# Patient Record
Sex: Male | Born: 1975 | Race: White | Hispanic: No | Marital: Married | Smoking: Former smoker
Health system: Southern US, Community
[De-identification: ages and names within clinical notes are randomized; demographics above are authoritative.]

---

## 2020-08-23 ENCOUNTER — Other Ambulatory Visit: Payer: Self-pay

## 2020-08-23 ENCOUNTER — Encounter (HOSPITAL_COMMUNITY): Payer: Self-pay

## 2020-08-23 ENCOUNTER — Emergency Department (HOSPITAL_COMMUNITY)
Admission: EM | Admit: 2020-08-23 | Discharge: 2020-08-23 | Disposition: A | Discharge status: Home / Self Care | Payer: Self-pay | Attending: Emergency Medicine | Admitting: Emergency Medicine

## 2020-08-23 ENCOUNTER — Emergency Department (HOSPITAL_COMMUNITY): Payer: Self-pay

## 2020-08-23 DIAGNOSIS — M25532 Pain in left wrist: Secondary | ICD-10-CM | POA: Insufficient documentation

## 2020-08-23 DIAGNOSIS — Z5321 Procedure and treatment not carried out due to patient leaving prior to being seen by health care provider: Secondary | ICD-10-CM | POA: Insufficient documentation

## 2020-08-23 DIAGNOSIS — W19XXXA Unspecified fall, initial encounter: Secondary | ICD-10-CM | POA: Insufficient documentation

## 2020-08-23 NOTE — ED Triage Notes (Signed)
Pt presents with Left wrist pain d/t fall 3 days ago

## 2020-08-23 NOTE — ED Notes (Signed)
Called pt x2 for room, no response. 

## 2020-08-24 NOTE — ED Provider Notes (Signed)
Patient left without being seen. I didn't establish care with patient    Charlynne Pander, MD 08/24/20 1455

## 2020-11-23 ENCOUNTER — Encounter (HOSPITAL_COMMUNITY): Payer: Self-pay

## 2020-11-23 ENCOUNTER — Emergency Department (HOSPITAL_COMMUNITY)
Admission: EM | Admit: 2020-11-23 | Discharge: 2020-11-23 | Disposition: A | Discharge status: Home / Self Care | Payer: Self-pay | Attending: Emergency Medicine | Admitting: Emergency Medicine

## 2020-11-23 ENCOUNTER — Emergency Department (HOSPITAL_COMMUNITY): Payer: Self-pay

## 2020-11-23 DIAGNOSIS — S7002XA Contusion of left hip, initial encounter: Secondary | ICD-10-CM | POA: Insufficient documentation

## 2020-11-23 DIAGNOSIS — W1839XA Other fall on same level, initial encounter: Secondary | ICD-10-CM | POA: Diagnosis not present

## 2020-11-23 DIAGNOSIS — Y990 Civilian activity done for income or pay: Secondary | ICD-10-CM | POA: Diagnosis not present

## 2020-11-23 DIAGNOSIS — W19XXXA Unspecified fall, initial encounter: Secondary | ICD-10-CM

## 2020-11-23 DIAGNOSIS — Z87891 Personal history of nicotine dependence: Secondary | ICD-10-CM | POA: Diagnosis not present

## 2020-11-23 DIAGNOSIS — Y9301 Activity, walking, marching and hiking: Secondary | ICD-10-CM | POA: Diagnosis not present

## 2020-11-23 DIAGNOSIS — S79912A Unspecified injury of left hip, initial encounter: Secondary | ICD-10-CM | POA: Diagnosis present

## 2020-11-23 MED ORDER — KETOROLAC TROMETHAMINE 15 MG/ML IJ SOLN
15.0000 mg | Freq: Once | INTRAMUSCULAR | Status: AC
  Administered 2020-11-23: 15 mg via INTRAMUSCULAR
  Filled 2020-11-23: qty 1

## 2020-11-23 NOTE — ED Provider Notes (Signed)
[facility]-EMERGENCY DEPT Provider Note   CSN: [id number] Arrival date & time: 11/23/20  1316     History Chief Complaint  Patient presents with  . Fall    Craig Clayton is a 45 y.o. male.  45 year old male with prior medical history as detailed below presents for evaluation.  Patient reports that he fell on Friday while at work.  He slipped on a wet floor and fell backwards and landed hard on his left posterior hip.  Patient reports he has been walking since the fall.  He is taken ibuprofen at home with some degree of relief of his symptoms.  He denies head injury.  He denies neck pain.  He denies other significant injury.  He requests x-ray primarily for purposes of his Worker's Comp. claim.  The history is provided by the patient and medical records.  Fall This is a new problem. The current episode started 2 days ago. The problem occurs rarely. The problem has not changed since onset.Pertinent negatives include no chest pain and no abdominal pain. Nothing aggravates the symptoms. Nothing relieves the symptoms.       History reviewed. No pertinent past medical history.  There are no problems to display for this patient.   History reviewed. No pertinent surgical history.     History reviewed. No pertinent family history.  Social History   Tobacco Use  . Smoking status: Former Games developer  . Smokeless tobacco: Never Used    Home Medications Prior to Admission medications   Not on File    Allergies    Tylenol with codeine #3 [acetaminophen-codeine]  Review of Systems   Review of Systems  Cardiovascular: Negative for chest pain.  Gastrointestinal: Negative for abdominal pain.  All other systems reviewed and are negative.   Physical Exam Updated Vital Signs BP (!) 156/89 (BP Location: Right Arm)   Pulse 78   Temp 98.6 F (37 C) (Oral)   Resp 18   SpO2 98%   Physical Exam Vitals and nursing note reviewed.  Constitutional:      General:  He is not in acute distress.    Appearance: Normal appearance. He is well-developed.  HENT:     Head: Normocephalic and atraumatic.  Eyes:     Conjunctiva/sclera: Conjunctivae normal.     Pupils: Pupils are equal, round, and reactive to light.  Cardiovascular:     Rate and Rhythm: Normal rate and regular rhythm.     Heart sounds: Normal heart sounds.  Pulmonary:     Effort: Pulmonary effort is normal. No respiratory distress.     Breath sounds: Normal breath sounds.  Abdominal:     General: There is no distension.     Palpations: Abdomen is soft.     Tenderness: There is no abdominal tenderness.  Musculoskeletal:        General: Tenderness present. No deformity. Normal range of motion.     Cervical back: Normal range of motion and neck supple.     Comments: Mild diffuse tenderness over the posterior left lateral hip  Normal gait  Distal LLE is NVI   Skin:    General: Skin is warm and dry.  Neurological:     Mental Status: He is alert and oriented to person, place, and time.     ED Results / Procedures / Treatments   Labs (all labs ordered are listed, but only abnormal results are displayed) Labs Reviewed - No data to display  EKG None  Radiology DG Hip  Unilat W or Wo Pelvis 2-3 Views Left  Result Date: 11/23/2020 CLINICAL DATA:  Fall 2 days ago.  Left hip pain.  Initial encounter. EXAM: DG HIP (WITH OR WITHOUT PELVIS) 2-3V LEFT COMPARISON:  None. FINDINGS: There is no evidence of hip fracture or dislocation. There is no evidence of arthropathy or other focal bone abnormality. IMPRESSION: Negative. Electronically Signed   By: Danae Orleans M.D.   On: 11/23/2020 14:01    Procedures Procedures   Medications Ordered in ED Medications  ketorolac (TORADOL) 15 MG/ML injection 15 mg (15 mg Intramuscular Given 11/23/20 1340)    ED Course  I have reviewed the triage vital signs and the nursing notes.  Pertinent labs & imaging results that were available during my care of  the patient were reviewed by me and considered in my medical decision making (see chart for details).    MDM Rules/Calculators/A&P                          MDM  MSE complete  Craig Clayton was evaluated in Emergency Department on 11/23/2020 for the symptoms described in the history of present illness. He was evaluated in the context of the global COVID-19 pandemic, which necessitated consideration that the patient might be at risk for infection with the SARS-CoV-2 virus that causes COVID-19. Institutional protocols and algorithms that pertain to the evaluation of patients at risk for COVID-19 are in a state of rapid change based on information released by regulatory bodies including the CDC and federal and state organizations. These policies and algorithms were followed during the patient's care in the ED.  Patient is presenting for evaluation after reported fall.  Exam and imaging are without evidence of significant traumatic injury.  Importance of close follow-up was stressed.  Strict return precautions given and understood.     Final Clinical Impression(s) / ED Diagnoses Final diagnoses:  Fall, initial encounter  Contusion of left hip, initial encounter    Rx / DC Orders ED Discharge Orders    None       Wynetta Fines, MD 11/23/20 1413

## 2020-11-23 NOTE — Discharge Instructions (Addendum)
Please return for any problem.  °

## 2020-11-23 NOTE — ED Triage Notes (Signed)
Pt presents with c/o fall that occurred on Friday afternoon at work. Pt ambulatory to triage. Pt reported that his back was really sore as well as his left hip when he woke up yesterday.

## 2021-02-04 ENCOUNTER — Encounter (HOSPITAL_COMMUNITY): Payer: Self-pay | Admitting: Emergency Medicine

## 2021-02-04 ENCOUNTER — Emergency Department (HOSPITAL_COMMUNITY): Payer: Self-pay

## 2021-02-04 ENCOUNTER — Emergency Department (HOSPITAL_COMMUNITY)
Admission: EM | Admit: 2021-02-04 | Discharge: 2021-02-05 | Disposition: A | Discharge status: Home / Self Care | Payer: Self-pay | Attending: Emergency Medicine | Admitting: Emergency Medicine

## 2021-02-04 DIAGNOSIS — Z87891 Personal history of nicotine dependence: Secondary | ICD-10-CM | POA: Insufficient documentation

## 2021-02-04 DIAGNOSIS — S0990XA Unspecified injury of head, initial encounter: Secondary | ICD-10-CM | POA: Diagnosis not present

## 2021-02-04 DIAGNOSIS — M25532 Pain in left wrist: Secondary | ICD-10-CM | POA: Insufficient documentation

## 2021-02-04 DIAGNOSIS — M5432 Sciatica, left side: Secondary | ICD-10-CM | POA: Diagnosis not present

## 2021-02-04 DIAGNOSIS — W1830XA Fall on same level, unspecified, initial encounter: Secondary | ICD-10-CM | POA: Diagnosis not present

## 2021-02-04 NOTE — ED Provider Notes (Signed)
Emergency Medicine Provider Triage Evaluation Note  Craig Clayton , a 45 y.o. male  was evaluated in triage.  Pt complains of generalized body aches, left wrist pain, and left hip pain.  Pain constant since April of this year.  Patient has had no recent falls or injuries.  Patient denies any numbness, weakness, saddle anesthesia, bowel or bladder dysfunction, fevers, chills.  Patient denies any IV drug use  Review of Systems  Positive: Generalized myalgia, left wrist pain with hip pain Negative: umbness, weakness, saddle anesthesia, bowel or bladder dysfunction, fevers, chills  Physical Exam  BP (!) 148/93   Pulse 68   Temp 98 F (36.7 C)   Resp 18   SpO2 100%  Gen:   Awake, no distress   Resp:  Normal effort  MSK:   Moves extremities without difficulty.  Full range of motion to left wrist, diffuse tenderness to left wrist.  Tenderness to left hip, no deformity or crepitus noted.  Pelvis stable. Other:    Medical Decision Making  Medically screening exam initiated at 1:12 PM.  Appropriate orders placed.  Craig Clayton was informed that the remainder of the evaluation will be completed by another provider, this initial triage assessment does not replace that evaluation, and the importance of remaining in the ED until their evaluation is complete.  The patient appears stable so that the remainder of the work up may be completed by another provider.      Haskel Schroeder, PA-C 02/04/21 1314    Arby Barrette, MD 02/05/21 [phone]

## 2021-02-04 NOTE — ED Triage Notes (Signed)
Pt here with c/o general aches and pains from a fall in [employer] from April of this year

## 2021-02-05 ENCOUNTER — Emergency Department (HOSPITAL_COMMUNITY): Payer: Self-pay

## 2021-02-05 MED ORDER — METHOCARBAMOL 500 MG PO TABS: 500.0000 mg | ORAL_TABLET | Freq: Three times a day (TID) | ORAL | 0 refills | Status: AC | PRN

## 2021-02-05 MED ORDER — METHYLPREDNISOLONE 4 MG PO TBPK: ORAL_TABLET | ORAL | 0 refills | Status: AC

## 2021-02-05 NOTE — Discharge Instructions (Addendum)
Take the steroids and muscle relaxers as prescribed.  Follow-up with your primary doctor as well as neurologist.  Return to the ED with weakness, numbness, tingling, difficulty controlling your bowels or bladder or any other concerns.

## 2021-02-05 NOTE — ED Provider Notes (Signed)
[facility] EMERGENCY DEPARTMENT Provider Note   CSN: [id number] Arrival date & time: 02/04/21  1219     History No chief complaint on file.   Craig Clayton is a 45 y.o. male.   Patient complains of left hip and left wrist pain ongoing for several years ever since a fall in 2013 but worsening over the past several weeks.  States he fell again 2 months ago on a wet floor while washing dishes at [facility] and symptoms of gotten worse since then.  Last fall was 2 months ago.  Has increased pain to his wrist and left hip that radiates down his left leg.  Denies any weakness, numbness, tingling, bowel bladder incontinence, fever or vomiting.  No history of IV drug abuse or cancer.  No history of back surgery.  Did have surgery on his wrist in 2013 at [facility].  Intermittently has pain in this area but became worse over the past several weeks.  Taking ibuprofen without relief.  Also having left hip pain which he has had since his fall in 2013 that radiates down his left leg.  No chest pain or shortness of breath.  No abdominal pain.  Also concerned about headaches that he has been having since his fall in April.  States he hit his head and may be lost consciousness.  Having intermittent episodes of confusion and headaches since then.  No visual changes.  No focal weakness, numbness or tingling.   The history is provided by the patient.      History reviewed. No pertinent past medical history.  There are no problems to display for this patient.   History reviewed. No pertinent surgical history.     No family history on file.  Social History   Tobacco Use   Smoking status: Former    Pack years: 0.00   Smokeless tobacco: Never    Home Medications Prior to Admission medications   Not on File    Allergies    Tylenol with codeine #3 [acetaminophen-codeine]  Review of Systems   Review of Systems  Constitutional:  Negative for activity change, appetite change and fever.   HENT:  Negative for congestion.   Eyes:  Negative for visual disturbance.  Respiratory:  Negative for cough, chest tightness and shortness of breath.   Gastrointestinal:  Negative for abdominal pain, nausea and vomiting.  Genitourinary:  Negative for dysuria and hematuria.  Musculoskeletal:  Positive for arthralgias and myalgias.  Skin:  Negative for wound.  Neurological:  Positive for headaches. Negative for weakness and light-headedness.   all other systems are negative except as noted in the HPI and PMH.   Physical Exam Updated Vital Signs BP 134/78   Pulse 75   Temp 98 F (36.7 C)   Resp (!) 22   SpO2 100%   Physical Exam Vitals and nursing note reviewed.  Constitutional:      General: He is not in acute distress.    Appearance: He is well-developed.     Comments: Smells of marijuana  HENT:     Head: Normocephalic and atraumatic.     Mouth/Throat:     Pharynx: No oropharyngeal exudate.  Eyes:     Conjunctiva/sclera: Conjunctivae normal.     Pupils: Pupils are equal, round, and reactive to light.  Neck:     Comments: No meningismus. Cardiovascular:     Rate and Rhythm: Normal rate and regular rhythm.     Heart sounds: Normal heart sounds. No murmur heard.  Pulmonary:     Effort: Pulmonary effort is normal. No respiratory distress.     Breath sounds: Normal breath sounds.  Abdominal:     Palpations: Abdomen is soft.     Tenderness: There is no abdominal tenderness. There is no guarding or rebound.  Musculoskeletal:        General: No tenderness. Normal range of motion.     Cervical back: Normal range of motion and neck supple.     Comments: Left wrist with well-healed surgical incision.  Flexion extension are intact, cardinal hand movements are intact.  Diffusely tender.  Not warm or erythematous  Pain with range of motion of left hip. 5/5 strength in bilateral lower extremities. Ankle plantar and dorsiflexion intact. Great toe extension intact bilaterally. +2 DP  and PT pulses. +2 patellar reflexes bilaterally. Normal gait.   Skin:    General: Skin is warm.  Neurological:     Mental Status: He is alert and oriented to person, place, and time.     Cranial Nerves: No cranial nerve deficit.     Motor: No abnormal muscle tone.     Coordination: Coordination normal.     Comments:  5/5 strength throughout. CN 2-12 intact.Equal grip strength.   Psychiatric:        Behavior: Behavior normal.    ED Results / Procedures / Treatments   Labs (all labs ordered are listed, but only abnormal results are displayed) Labs Reviewed - No data to display  EKG None  Radiology DG Wrist Complete Left  Result Date: 02/04/2021 CLINICAL DATA:  Left wrist pain.  Fall 1 month ago. EXAM: LEFT WRIST - COMPLETE 3+ VIEW COMPARISON:  08/23/2020 FINDINGS: Sequelae of distal radius ORIF are again identified with a volar plate and screws remaining in place. A remote, nonunited ulnar styloid fracture is again noted. No acute fracture or dislocation is identified. Joint space widths are maintain. There is mild soft tissue swelling about the wrist. IMPRESSION: No acute osseous abnormality identified. Electronically Signed   By: Sebastian Ache M.D.   On: 02/04/2021 14:04   DG Hip Unilat W or Wo Pelvis 2-3 Views Left  Result Date: 02/04/2021 CLINICAL DATA:  Left hip pain since a fall 1 month ago. Initial encounter. EXAM: DG HIP (WITH OR WITHOUT PELVIS) 2-3V LEFT COMPARISON:  None. FINDINGS: There is no evidence of hip fracture or dislocation. There is no evidence of arthropathy or other focal bone abnormality. Tiny accessory os acetabuli along the periphery of the left acetabulum noted. IMPRESSION: No acute abnormality. Electronically Signed   By: Drusilla Kanner M.D.   On: 02/04/2021 14:01    Procedures Procedures   Medications Ordered in ED Medications - No data to display  ED Course  I have reviewed the triage vital signs and the nursing notes.  Pertinent labs & imaging  results that were available during my care of the patient were reviewed by me and considered in my medical decision making (see chart for details).    MDM Rules/Calculators/A&P                         Ongoing pain in left wrist and hip after fall several years ago.  X-ray today shows stable hardware without acute injury.  Neurovascular intact. No evidence of septic joint  Left hip pain appears consistent with sciatica.  No evidence of cord compression or cauda equina.  No neurological red flags.  Patient also concerned about head injury after  fall 2 months ago.  Intermittent headaches and confusion since then. Requesting head CT.  Suspect concussion.  CT head is negative.  Advised headaches, dizziness and nausea may go on for several weeks.  Needs PCP follow-up.  Refer back to surgeon who did his wrist surgery. will give her splint.  No evidence of septic joint. No evidence of cord compression or cauda equina  Return precautions discussed Final Clinical Impression(s) / ED Diagnoses Final diagnoses:  Sciatica, left side  Closed head injury, initial encounter    Rx / DC Orders ED Discharge Orders     None        Valyncia Wiens, Jeannett Senior, MD 02/05/21 [phone]

## 2021-03-04 ENCOUNTER — Emergency Department (HOSPITAL_COMMUNITY)
Admission: EM | Admit: 2021-03-04 | Discharge: 2021-03-05 | Discharge status: Against Medical Advice | Payer: Self-pay | Attending: Emergency Medicine | Admitting: Emergency Medicine

## 2021-03-04 DIAGNOSIS — Z5321 Procedure and treatment not carried out due to patient leaving prior to being seen by health care provider: Secondary | ICD-10-CM | POA: Insufficient documentation

## 2021-03-04 DIAGNOSIS — M25551 Pain in right hip: Secondary | ICD-10-CM | POA: Diagnosis not present

## 2021-03-04 NOTE — ED Triage Notes (Signed)
Pt reports that his R hip has been hurting since he fell two months ago and mow having pain in his R heel.

## 2021-03-05 ENCOUNTER — Emergency Department (HOSPITAL_COMMUNITY): Payer: Self-pay

## 2021-03-05 ENCOUNTER — Encounter (HOSPITAL_COMMUNITY): Payer: Self-pay

## 2021-03-05 MED ORDER — ACETAMINOPHEN 325 MG PO TABS
650.0000 mg | ORAL_TABLET | Freq: Once | ORAL | Status: AC
  Administered 2021-03-05: 650 mg via ORAL
  Filled 2021-03-05: qty 2

## 2021-03-05 NOTE — ED Provider Notes (Signed)
Emergency Medicine Provider Triage Evaluation Note  Craig Clayton , a 45 y.o. male  was evaluated in triage.  Pt complains of arthralgias.  The patient reports that he fell 2 months ago.  Patient states that he fell 21 feet.  He was seen and evaluated shortly after the fall.  He reports that he has had persistent left hip pain since the incident.  He was seen for the same left hip pain at 1 month ago.  He returns today as the pain has not resolved.  No new falls or injuries.  He adds that he has been having pain in his left ankle and Achilles tendon for the last 3 days.  He does have some redness noted to his left foot from his foot rubbing against his sandal.  No new numbness, weakness, falls, injuries, left knee pain, fevers, chills.  He denies IV drug use.  No recent antibiotics.  Review of Systems  Positive: Arthralgias, myalgias, wound, redness Negative: Fever, chills, left knee pain, numbness, weakness, abdominal pain, chest pain, shortness of breath, loss of consciousness  Physical Exam  BP 129/80 (BP Location: Right Arm)   Pulse 88   Temp 98.6 F (37 C) (Oral)   Resp 18   SpO2 98%  Gen:   Awake, no distress   Resp:  Normal effort  MSK:   Moves extremities without difficulty  Other:  There is a wound overlying the first MTP on the left foot with some erythema.  No warmth or obvious swelling.  Left Achilles tendon is intact.  Full active and passive range of motion the left ankle.  Minimal tenderness to palpation noted to the left posterior ankle.  Left ankle is  Medical Decision Making  Medically screening exam initiated at 12:01 AM.  Appropriate orders placed.  Craig Clayton was informed that the remainder of the evaluation will be completed by another provider, this initial triage assessment does not replace that evaluation, and the importance of remaining in the ED until their evaluation is complete.  45 year old male presenting with left hip pain for the last 3 months that began after  a fall.  Will defer imaging of the left hip at this time as he has had 2 previous images since the fall that have been unremarkable.  No new trauma or injury since he was last seen in the ED a month ago.  We will x-ray the left ankle.  Achilles tendon appears intact on exam.  Tylenol given for pain control.  He will require further work-up and evaluation in the emergency department.   Barkley Boards, PA-C 03/05/21 Marina Gravel, MD 03/05/21 [phone]

## 2021-03-05 NOTE — ED Notes (Signed)
Pt stated he had to catch bus and left.

## 2023-02-16 IMAGING — CT CT HEAD W/O CM
4 series · 17 of 47 positions shown, 19 images · non-contrast
Comparison: None.

CLINICAL DATA: Fall

EXAM:
CT HEAD WITHOUT CONTRAST
TECHNIQUE: Contiguous axial images were obtained from the base of the skull
through the vertex without intravenous contrast.

[Series 3: head wo · axial · 0.41mm/px · z∈[+1148,+1268]mm · 7 of 32 slices shown, 9 images]
[im 4/32  brain]
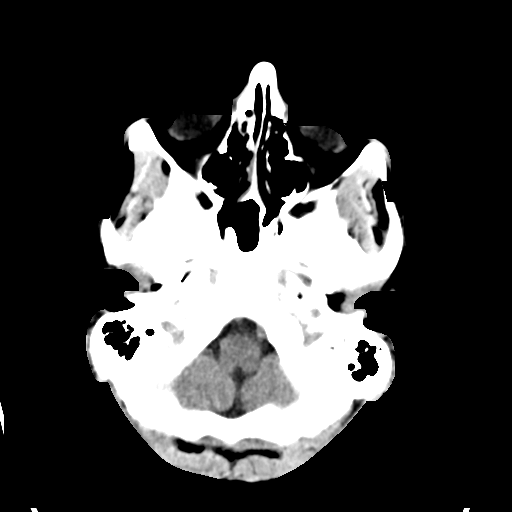
[im 4/32  bone]
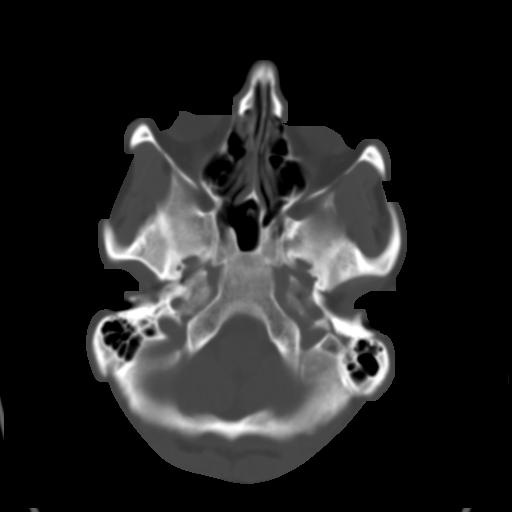
[im 8/32  brain]
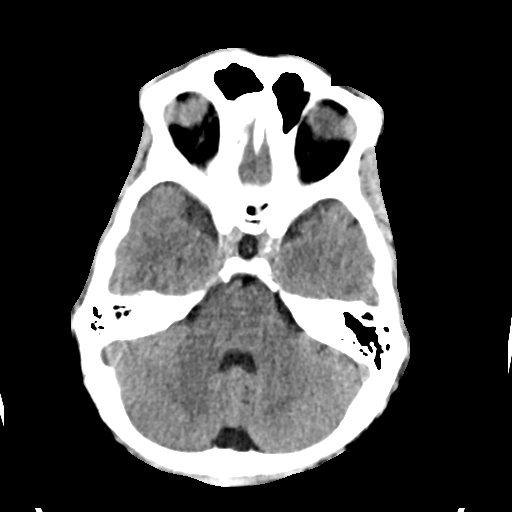
[im 12/32  brain]
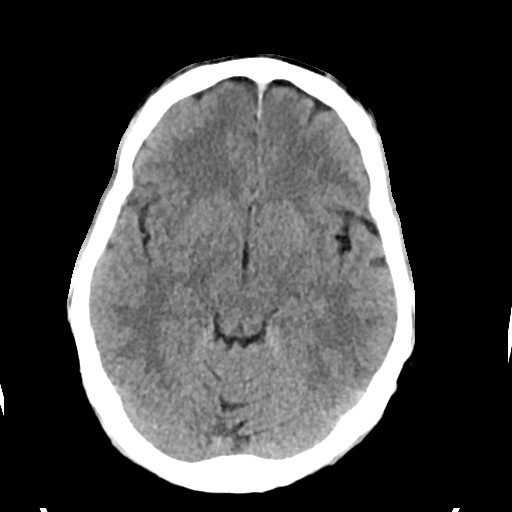
[im 16/32  brain]
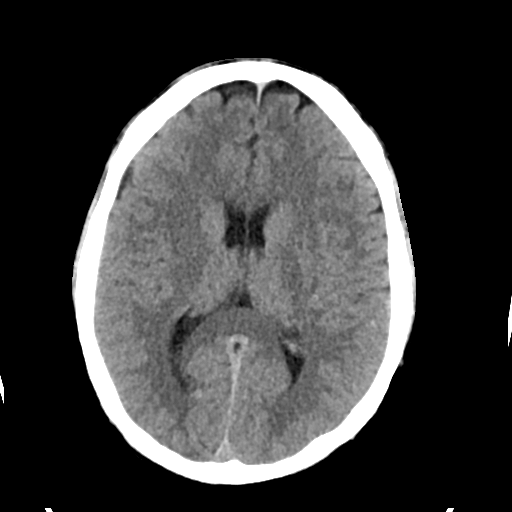
[im 20/32  brain]
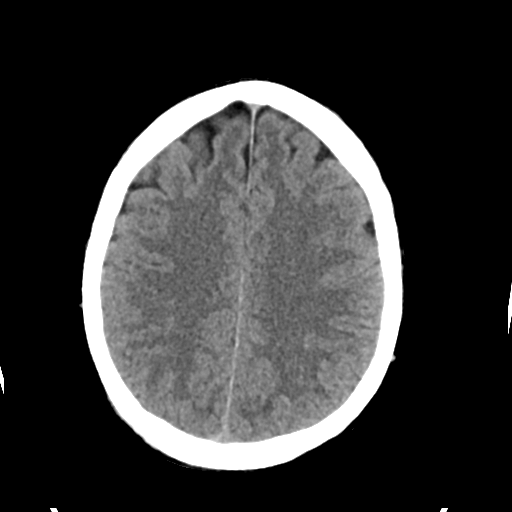
[im 20/32  bone]
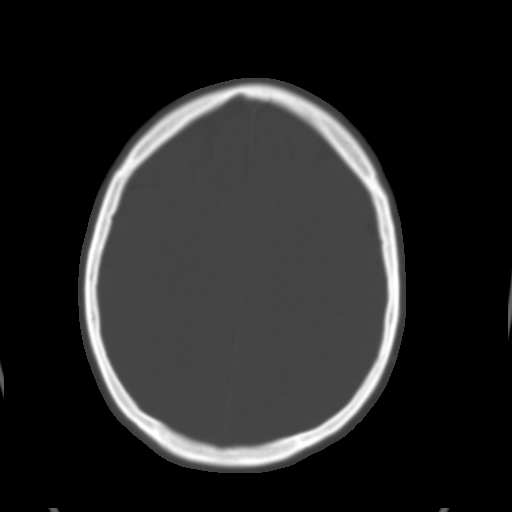
[im 24/32  brain]
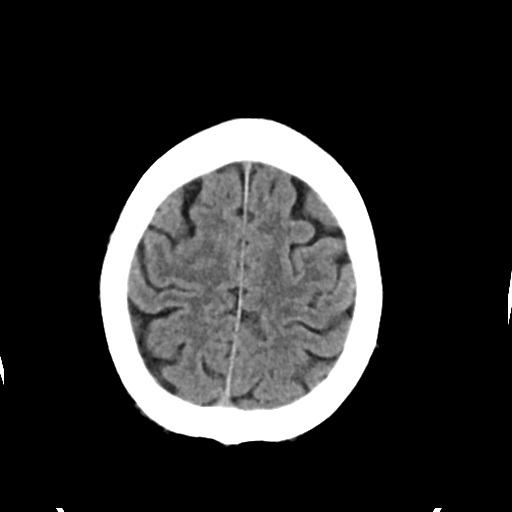
[im 28/32  brain]
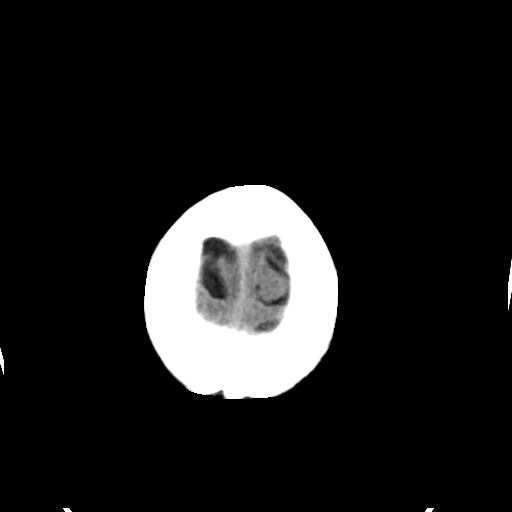

[Series 4: head bone · axial · 0.41mm/px · z∈[+1146,+1202]mm · 4 of 79 slices shown]
[im 8/79  bone]
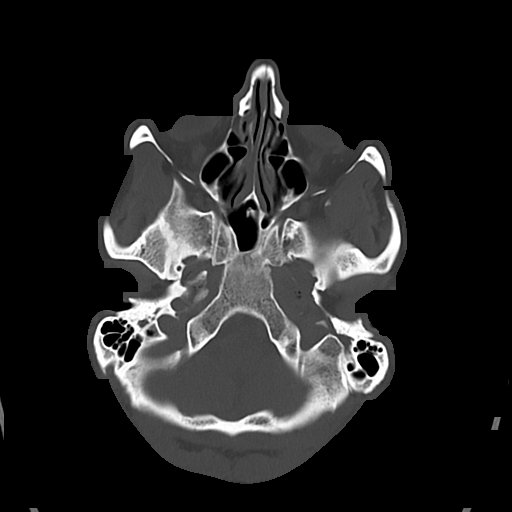
[im 16/79  bone]
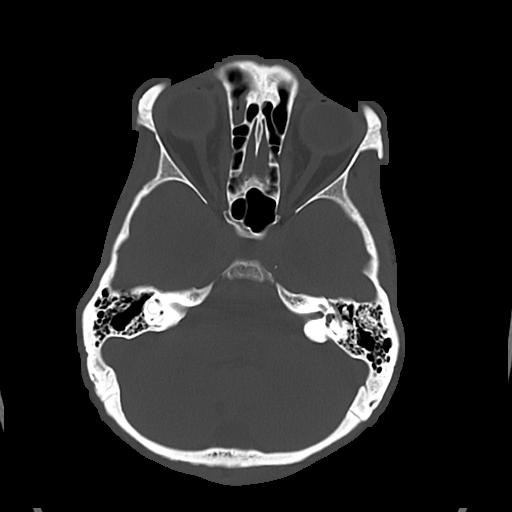
[im 24/79  bone]
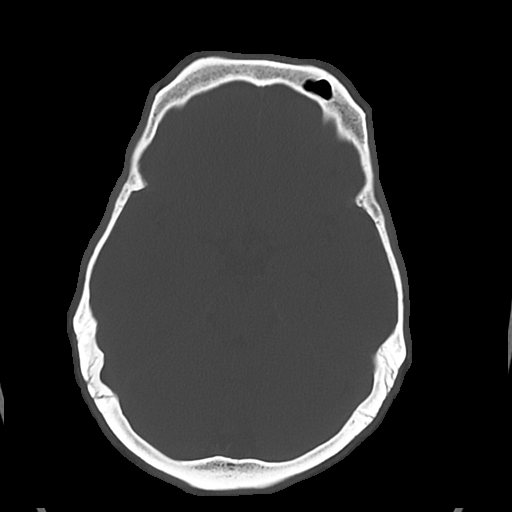
[im 36/79  bone]
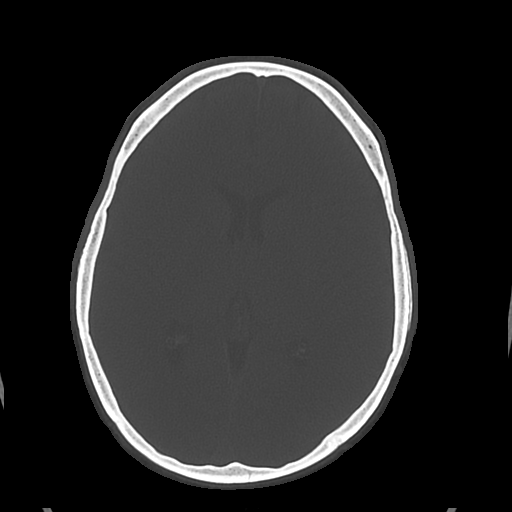

[Series 5: cor soft · coronal · 0.34mm/px · 3 of 68 slices shown]
[im 23/68  brain]
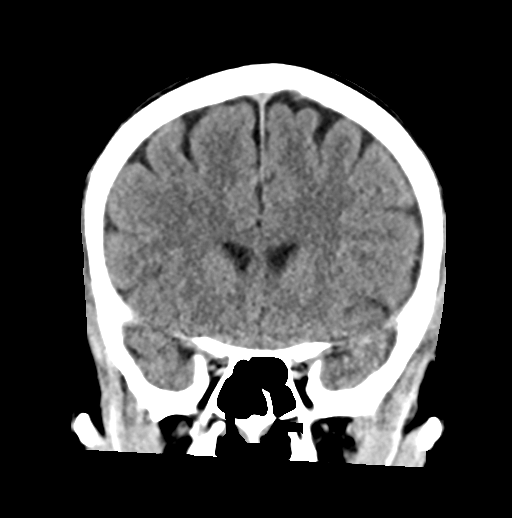
[im 30/68  brain]
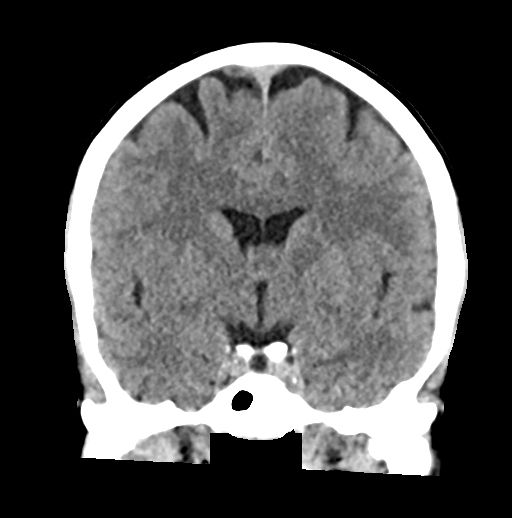
[im 38/68  brain]
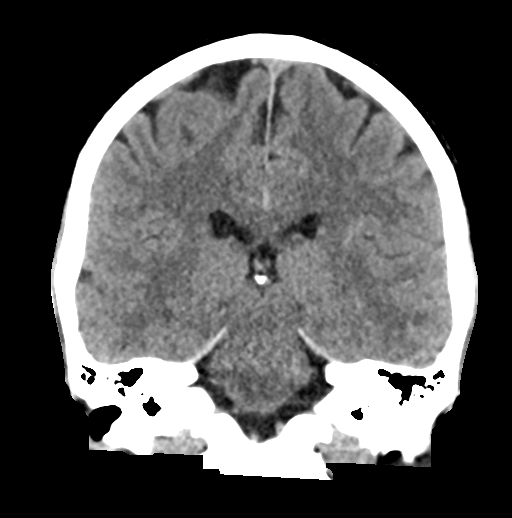

[Series 6: sag soft · sagittal · 0.33mm/px · 3 of 58 slices shown]
[im 20/58  brain]
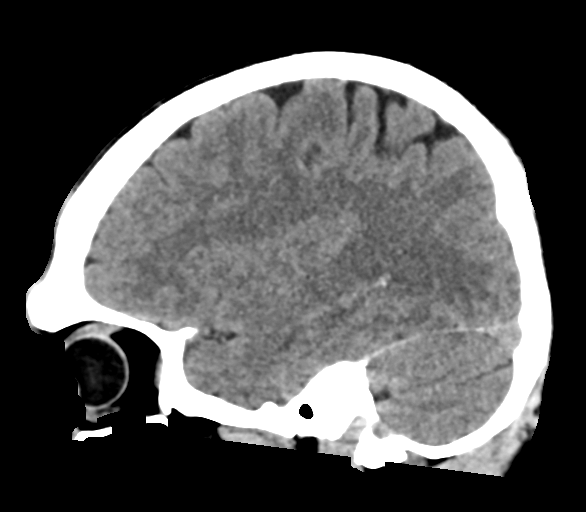
[im 29/58  brain]
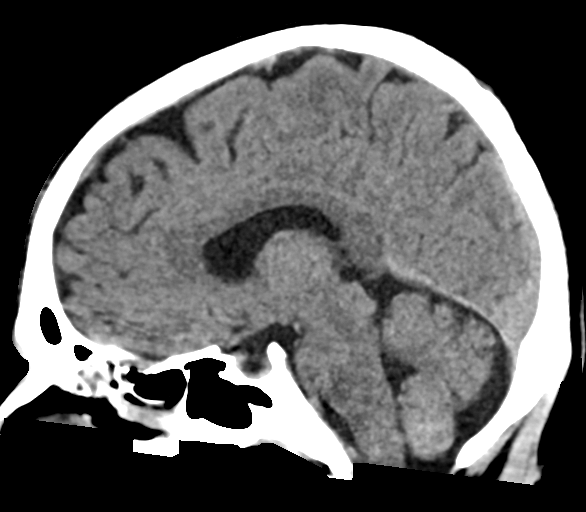
[im 39/58  brain]
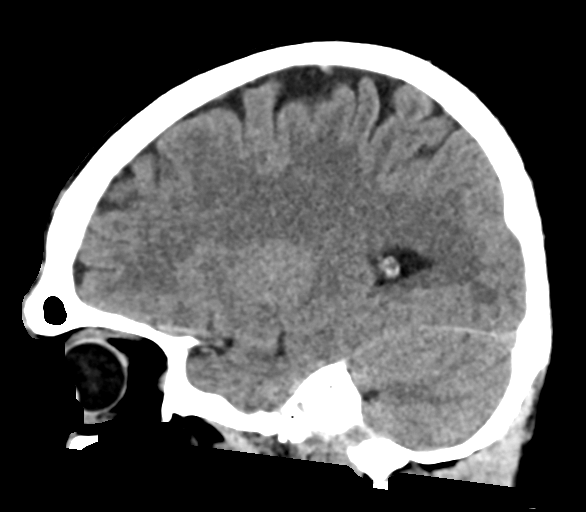

[17 of 47 positions shown; findings below may reference images not displayed]

FINDINGS: Brain: There is no mass, hemorrhage or extra-axial collection. The
size and configuration of the ventricles and extra-axial CSF spaces
are normal. The brain parenchyma is normal, without acute or chronic
infarction.

Vascular: No abnormal hyperdensity of the major intracranial
arteries or dural venous sinuses. No intracranial atherosclerosis.

Skull: The visualized skull base, calvarium and extracranial soft
tissues are normal.

Sinuses/Orbits: No fluid levels or advanced mucosal thickening of
the visualized paranasal sinuses. No mastoid or middle ear effusion.
The orbits are normal.
IMPRESSION: Normal head CT.
